# Patient Record
Sex: Female | Born: 1975 | Race: White | Hispanic: No | Marital: Single | Smoking: Never smoker
Health system: Southern US, Community
[De-identification: ages and names within clinical notes are randomized; demographics above are authoritative.]

---

## 1997-12-12 ENCOUNTER — Inpatient Hospital Stay (HOSPITAL_COMMUNITY): Admission: AD | Admit: 1997-12-12 | Discharge: 1997-12-12 | Payer: Self-pay | Admitting: Obstetrics and Gynecology

## 1998-01-21 ENCOUNTER — Inpatient Hospital Stay (HOSPITAL_COMMUNITY): Admission: AD | Admit: 1998-01-21 | Discharge: 1998-01-23 | Payer: Self-pay | Admitting: Obstetrics and Gynecology

## 1998-01-23 ENCOUNTER — Encounter: Admission: RE | Admit: 1998-01-23 | Discharge: 1998-04-23 | Payer: Self-pay | Admitting: Obstetrics and Gynecology

## 1998-06-01 ENCOUNTER — Encounter (HOSPITAL_COMMUNITY): Admission: RE | Admit: 1998-06-01 | Discharge: 1998-08-30 | Payer: Self-pay | Admitting: *Deleted

## 1999-09-21 ENCOUNTER — Other Ambulatory Visit: Admission: RE | Admit: 1999-09-21 | Discharge: 1999-09-21 | Payer: Self-pay | Admitting: Obstetrics and Gynecology

## 1999-11-04 ENCOUNTER — Inpatient Hospital Stay (HOSPITAL_COMMUNITY): Admission: AD | Admit: 1999-11-04 | Discharge: 1999-11-04 | Payer: Self-pay | Admitting: Obstetrics and Gynecology

## 2000-04-03 ENCOUNTER — Inpatient Hospital Stay (HOSPITAL_COMMUNITY): Admission: AD | Admit: 2000-04-03 | Discharge: 2000-04-04 | Payer: Self-pay | Admitting: Obstetrics and Gynecology

## 2000-05-16 ENCOUNTER — Other Ambulatory Visit: Admission: RE | Admit: 2000-05-16 | Discharge: 2000-05-16 | Payer: Self-pay | Admitting: Obstetrics and Gynecology

## 2000-06-05 ENCOUNTER — Encounter: Admission: RE | Admit: 2000-06-05 | Discharge: 2000-09-03 | Payer: Self-pay | Admitting: Obstetrics and Gynecology

## 2000-09-05 ENCOUNTER — Encounter: Admission: RE | Admit: 2000-09-05 | Discharge: 2000-10-20 | Payer: Self-pay | Admitting: Obstetrics and Gynecology

## 2001-05-19 ENCOUNTER — Other Ambulatory Visit: Admission: RE | Admit: 2001-05-19 | Discharge: 2001-05-19 | Payer: Self-pay | Admitting: Obstetrics and Gynecology

## 2002-06-16 ENCOUNTER — Other Ambulatory Visit: Admission: RE | Admit: 2002-06-16 | Discharge: 2002-06-16 | Payer: Self-pay | Admitting: Obstetrics and Gynecology

## 2003-06-21 ENCOUNTER — Other Ambulatory Visit: Admission: RE | Admit: 2003-06-21 | Discharge: 2003-06-21 | Payer: Self-pay | Admitting: Obstetrics and Gynecology

## 2003-11-07 ENCOUNTER — Encounter (INDEPENDENT_AMBULATORY_CARE_PROVIDER_SITE_OTHER): Payer: Self-pay | Admitting: *Deleted

## 2003-11-07 ENCOUNTER — Ambulatory Visit (HOSPITAL_BASED_OUTPATIENT_CLINIC_OR_DEPARTMENT_OTHER): Admission: RE | Admit: 2003-11-07 | Discharge: 2003-11-07 | Payer: Self-pay | Admitting: Otolaryngology

## 2003-11-07 ENCOUNTER — Ambulatory Visit (HOSPITAL_COMMUNITY): Admission: RE | Admit: 2003-11-07 | Discharge: 2003-11-07 | Payer: Self-pay | Admitting: Otolaryngology

## 2004-05-29 ENCOUNTER — Encounter: Admission: RE | Admit: 2004-05-29 | Discharge: 2004-05-29 | Payer: Self-pay | Admitting: Family Medicine

## 2004-08-04 ENCOUNTER — Emergency Department (HOSPITAL_COMMUNITY): Admission: EM | Admit: 2004-08-04 | Discharge: 2004-08-05 | Payer: Self-pay | Admitting: Emergency Medicine

## 2005-07-20 ENCOUNTER — Inpatient Hospital Stay (HOSPITAL_COMMUNITY): Admission: AD | Admit: 2005-07-20 | Discharge: 2005-07-20 | Payer: Self-pay | Admitting: Obstetrics & Gynecology

## 2005-08-11 ENCOUNTER — Inpatient Hospital Stay (HOSPITAL_COMMUNITY): Admission: AD | Admit: 2005-08-11 | Discharge: 2005-08-11 | Payer: Self-pay | Admitting: Obstetrics and Gynecology

## 2005-10-06 ENCOUNTER — Inpatient Hospital Stay (HOSPITAL_COMMUNITY): Admission: AD | Admit: 2005-10-06 | Discharge: 2005-10-06 | Payer: Self-pay | Admitting: Obstetrics and Gynecology

## 2005-10-14 ENCOUNTER — Inpatient Hospital Stay (HOSPITAL_COMMUNITY): Admission: AD | Admit: 2005-10-14 | Discharge: 2005-10-14 | Payer: Self-pay | Admitting: Obstetrics and Gynecology

## 2005-11-02 ENCOUNTER — Inpatient Hospital Stay (HOSPITAL_COMMUNITY): Admission: AD | Admit: 2005-11-02 | Discharge: 2005-11-02 | Payer: Self-pay | Admitting: *Deleted

## 2005-11-03 ENCOUNTER — Inpatient Hospital Stay (HOSPITAL_COMMUNITY): Admission: AD | Admit: 2005-11-03 | Discharge: 2005-11-03 | Payer: Self-pay | Admitting: *Deleted

## 2005-11-05 ENCOUNTER — Inpatient Hospital Stay (HOSPITAL_COMMUNITY): Admission: AD | Admit: 2005-11-05 | Discharge: 2005-11-08 | Payer: Self-pay | Admitting: Obstetrics and Gynecology

## 2005-11-09 ENCOUNTER — Encounter: Admission: RE | Admit: 2005-11-09 | Discharge: 2005-12-09 | Payer: Self-pay | Admitting: Obstetrics and Gynecology

## 2005-12-10 ENCOUNTER — Encounter: Admission: RE | Admit: 2005-12-10 | Discharge: 2006-01-08 | Payer: Self-pay | Admitting: Obstetrics and Gynecology

## 2006-01-09 ENCOUNTER — Encounter: Admission: RE | Admit: 2006-01-09 | Discharge: 2006-02-08 | Payer: Self-pay | Admitting: Obstetrics and Gynecology

## 2006-02-09 ENCOUNTER — Encounter: Admission: RE | Admit: 2006-02-09 | Discharge: 2006-03-10 | Payer: Self-pay | Admitting: Obstetrics and Gynecology

## 2006-03-11 ENCOUNTER — Encounter: Admission: RE | Admit: 2006-03-11 | Discharge: 2006-04-10 | Payer: Self-pay | Admitting: Obstetrics and Gynecology

## 2006-04-11 ENCOUNTER — Encounter: Admission: RE | Admit: 2006-04-11 | Discharge: 2006-05-11 | Payer: Self-pay | Admitting: Obstetrics and Gynecology

## 2006-05-12 ENCOUNTER — Encounter: Admission: RE | Admit: 2006-05-12 | Discharge: 2006-06-10 | Payer: Self-pay | Admitting: Obstetrics and Gynecology

## 2006-06-11 ENCOUNTER — Encounter: Admission: RE | Admit: 2006-06-11 | Discharge: 2006-07-11 | Payer: Self-pay | Admitting: Obstetrics and Gynecology

## 2007-02-22 ENCOUNTER — Emergency Department (HOSPITAL_COMMUNITY): Admission: EM | Admit: 2007-02-22 | Discharge: 2007-02-23 | Payer: Self-pay | Admitting: Emergency Medicine

## 2007-04-05 ENCOUNTER — Emergency Department (HOSPITAL_COMMUNITY): Admission: EM | Admit: 2007-04-05 | Discharge: 2007-04-05 | Payer: Self-pay | Admitting: Emergency Medicine

## 2007-04-12 ENCOUNTER — Inpatient Hospital Stay (HOSPITAL_COMMUNITY): Admission: AD | Admit: 2007-04-12 | Discharge: 2007-04-12 | Payer: Self-pay | Admitting: Obstetrics and Gynecology

## 2007-04-15 ENCOUNTER — Ambulatory Visit (HOSPITAL_COMMUNITY): Admission: RE | Admit: 2007-04-15 | Discharge: 2007-04-15 | Payer: Self-pay | Admitting: Obstetrics and Gynecology

## 2007-04-19 ENCOUNTER — Encounter: Admission: RE | Admit: 2007-04-19 | Discharge: 2007-04-19 | Payer: Self-pay | Admitting: Obstetrics and Gynecology

## 2007-05-06 ENCOUNTER — Ambulatory Visit (HOSPITAL_COMMUNITY): Admission: RE | Admit: 2007-05-06 | Discharge: 2007-05-06 | Payer: Self-pay | Admitting: Obstetrics and Gynecology

## 2007-05-06 ENCOUNTER — Encounter (INDEPENDENT_AMBULATORY_CARE_PROVIDER_SITE_OTHER): Payer: Self-pay | Admitting: Obstetrics and Gynecology

## 2007-06-09 ENCOUNTER — Emergency Department (HOSPITAL_COMMUNITY): Admission: EM | Admit: 2007-06-09 | Discharge: 2007-06-09 | Payer: Self-pay | Admitting: Emergency Medicine

## 2007-08-29 ENCOUNTER — Emergency Department (HOSPITAL_COMMUNITY): Admission: EM | Admit: 2007-08-29 | Discharge: 2007-08-29 | Payer: Self-pay | Admitting: Emergency Medicine

## 2007-10-10 ENCOUNTER — Emergency Department (HOSPITAL_COMMUNITY): Admission: EM | Admit: 2007-10-10 | Discharge: 2007-10-10 | Payer: Self-pay | Admitting: Emergency Medicine

## 2007-10-12 ENCOUNTER — Emergency Department (HOSPITAL_COMMUNITY): Admission: EM | Admit: 2007-10-12 | Discharge: 2007-10-12 | Payer: Self-pay | Admitting: Emergency Medicine

## 2007-10-23 ENCOUNTER — Emergency Department (HOSPITAL_COMMUNITY): Admission: EM | Admit: 2007-10-23 | Discharge: 2007-10-23 | Payer: Self-pay | Admitting: Emergency Medicine

## 2007-10-25 ENCOUNTER — Inpatient Hospital Stay (HOSPITAL_COMMUNITY): Admission: AD | Admit: 2007-10-25 | Discharge: 2007-10-25 | Payer: Self-pay | Admitting: *Deleted

## 2007-11-02 ENCOUNTER — Emergency Department (HOSPITAL_COMMUNITY): Admission: EM | Admit: 2007-11-02 | Discharge: 2007-11-02 | Payer: Self-pay | Admitting: Emergency Medicine

## 2007-11-04 ENCOUNTER — Emergency Department (HOSPITAL_COMMUNITY): Admission: EM | Admit: 2007-11-04 | Discharge: 2007-11-04 | Payer: Self-pay | Admitting: Family Medicine

## 2007-11-04 ENCOUNTER — Emergency Department (HOSPITAL_COMMUNITY): Admission: EM | Admit: 2007-11-04 | Discharge: 2007-11-05 | Payer: Self-pay | Admitting: Emergency Medicine

## 2007-11-14 ENCOUNTER — Emergency Department (HOSPITAL_COMMUNITY): Admission: EM | Admit: 2007-11-14 | Discharge: 2007-11-14 | Payer: Self-pay | Admitting: Emergency Medicine

## 2007-12-05 ENCOUNTER — Emergency Department (HOSPITAL_COMMUNITY): Admission: EM | Admit: 2007-12-05 | Discharge: 2007-12-05 | Payer: Self-pay | Admitting: Emergency Medicine

## 2007-12-12 ENCOUNTER — Emergency Department (HOSPITAL_COMMUNITY): Admission: EM | Admit: 2007-12-12 | Discharge: 2007-12-12 | Payer: Self-pay | Admitting: Emergency Medicine

## 2008-01-09 ENCOUNTER — Emergency Department (HOSPITAL_COMMUNITY): Admission: EM | Admit: 2008-01-09 | Discharge: 2008-01-09 | Payer: Self-pay | Admitting: Emergency Medicine

## 2008-05-09 ENCOUNTER — Emergency Department (HOSPITAL_COMMUNITY): Admission: EM | Admit: 2008-05-09 | Discharge: 2008-05-09 | Payer: Self-pay | Admitting: Emergency Medicine

## 2009-06-20 ENCOUNTER — Inpatient Hospital Stay (HOSPITAL_COMMUNITY): Admission: AD | Admit: 2009-06-20 | Discharge: 2009-06-20 | Payer: Self-pay | Admitting: Obstetrics & Gynecology

## 2009-10-18 ENCOUNTER — Inpatient Hospital Stay (HOSPITAL_COMMUNITY): Admission: AD | Admit: 2009-10-18 | Discharge: 2009-10-21 | Payer: Self-pay | Admitting: Obstetrics and Gynecology

## 2009-10-22 ENCOUNTER — Encounter: Admission: RE | Admit: 2009-10-22 | Discharge: 2009-11-21 | Payer: Self-pay | Admitting: Obstetrics & Gynecology

## 2009-12-20 ENCOUNTER — Encounter: Admission: RE | Admit: 2009-12-20 | Discharge: 2009-12-26 | Payer: Self-pay | Admitting: Obstetrics and Gynecology

## 2010-09-23 ENCOUNTER — Encounter: Payer: Self-pay | Admitting: Obstetrics and Gynecology

## 2010-11-23 LAB — CBC
HCT: 32.2 % — ABNORMAL LOW (ref 36.0–46.0)
HCT: 36.5 % (ref 36.0–46.0)
Hemoglobin: 10.8 g/dL — ABNORMAL LOW (ref 12.0–15.0)
Hemoglobin: 12.4 g/dL (ref 12.0–15.0)
MCHC: 33.7 g/dL (ref 30.0–36.0)
MCHC: 34 g/dL (ref 30.0–36.0)
MCV: 92.8 fL (ref 78.0–100.0)
MCV: 93.3 fL (ref 78.0–100.0)
Platelets: 123 10*3/uL — ABNORMAL LOW (ref 150–400)
Platelets: 149 10*3/uL — ABNORMAL LOW (ref 150–400)
RBC: 3.45 MIL/uL — ABNORMAL LOW (ref 3.87–5.11)
RBC: 3.94 MIL/uL (ref 3.87–5.11)
RDW: 13.9 % (ref 11.5–15.5)
RDW: 14 % (ref 11.5–15.5)
WBC: 11.3 10*3/uL — ABNORMAL HIGH (ref 4.0–10.5)
WBC: 12.9 10*3/uL — ABNORMAL HIGH (ref 4.0–10.5)

## 2010-11-23 LAB — RPR: RPR Ser Ql: NONREACTIVE

## 2011-01-15 NOTE — Op Note (Signed)
Cassandra Burgess, Cassandra Burgess                  ACCOUNT NO.:  [id number]   MEDICAL RECORD NO.:  [id number]          PATIENT TYPE:  AMB   LOCATION:  SDC                           FACILITY:  [facility]   PHYSICIAN:  Lenoard Aden, M.D.DATE OF BIRTH:  09-01-1976   DATE OF PROCEDURE:  05/06/2007  DATE OF DISCHARGE:                               OPERATIVE REPORT   PREOPERATIVE DIAGNOSIS:  Lost intrauterine device.   POSTOPERATIVE DIAGNOSIS:  Intrauterine device erosion through the lower  uterine segment with IUD adherent to the omentum and attached to the  lower uterine segment and also omental adhesions to the anterior  abdominal wall.   PROCEDURE:  Diagnostic laparoscopy, removal of intrauterine device,  lysis of adhesions.   SURGEON:  Lenoard Aden, M.D.   ASSISTANT:  Cordelia Pen A. Rosalio Macadamia, M.D.   ANESTHESIA:  General.   ESTIMATED BLOOD LOSS:  Less than 50 mL.   COMPLICATIONS:  None.   DRAINS:  None.   COUNTS:  Correct.   Patient to recovery in good condition.   BRIEF OP NOTE:  After being apprised of risks of anesthesia, infection,  bleeding, possible need for hysterectomy due to bleeding from IUD  removal, possible need for repair to bowel and bladder with organs with  potential injury, the patient brought to the operating where she was  administered a general anesthetic without complications, prepped and  draped usual sterile fashion.  Foley catheter placed.  Hulka tenaculum  placed per vagina.  Infraumbilical incision made with scalpel.  Veress  needle placed opening pressure -2 noted.  4L CO2 insufflated without  difficulty.  Trocar placed.  Atraumatic trocar entry noted.  Pictures  taken, normal liver, gallbladder bed, normal tubes, normal ovaries,  normal anterior and posterior cul-de-sac except in the anterior cul-de-  sac is noted that the Mirena IUD has eroded through the lower uterine  segment and is now attached to the lower uterine segment at its caudad  portion and both  arms are adherent to a piece of omentum which appears  to have traversed into the lower pelvis.  There is also omental  adhesions to the intra-abdominal wall.  A 5-mm port was then placed  suprapubically under direct visualization.  The IUD is elevated off the  lower uterine segment and removed sharply using sharp dissection with a  straight monopolar scissors.  Good hemostasis noted.  There is no injury  to the lower uterine segment noted.  No injury to the bladder flap was  noted and the omentum is without bleeding.  The IUD was removed through  the abdominal port and sent to pathology.  Strings which are noted are  also removed without difficulty.  At this time attention was turned  anterior abdominal wall adhesions whereby sharp dissection was performed  and the bleeding is controlled using Kleppinger bipolar cautery as the  omentum was dissected sharply off the anterior abdominal wall.  Good hemostasis was noted.  All instruments were removed under direct  visualization.  CO2 was released.  Incisions closed with a 0 Vicryl and  Dermabond.  Hulka tenaculum  removed per vagina and the Foley catheter  was removed.  The patient tolerated procedure well, was awakened and  transferred to recovery in good condition.      Lenoard Aden, M.D.  Electronically Signed     RJT/MEDQ  D:  05/06/2007  T:  05/06/2007  Job:  [id number]

## 2011-01-15 NOTE — Op Note (Signed)
Cassandra Burgess, Cassandra Burgess                  ACCOUNT NO.:  [id number]   MEDICAL RECORD NO.:  [id number]          PATIENT TYPE:  AMB   LOCATION:  SDC                           [facility]  [facility]   PHYSICIAN:  Lenoard Aden, M.D.DATE OF BIRTH:  [birth year]   DATE OF PROCEDURE:  04/15/2007  DATE OF DISCHARGE:                               OPERATIVE REPORT   PREOPERATIVE DIAGNOSIS:  Pelvic pain with lost intrauterine device.  IUD  identified in the lower uterine segment per ultrasound and the patient  declined MRI.   POSTOPERATIVE DIAGNOSIS:  Pelvic pain with lost intrauterine device.  IUD identified in the lower uterine segment per ultrasound and the  patient declined MRI.  Imbedded IUD.   OPERATION PERFORMED:  Diagnostic hysteroscopy, attempted removal of  intrauterine device.   SURGEON:  Lenoard Aden, M.D.   ASSISTANT:  None.   ANESTHESIA:  General by Raul Del, M.D.   ESTIMATED BLOOD LOSS:  Minimal.   COMPLICATIONS:  None.   DRAINS:  None.   Patient to recovery, good condition.   DESCRIPTION OF PROCEDURE:  After being apprised of the risks of  anesthesia, infection, bleeding, injury to abdominal organs, need for  repair, delayed versus immediate complications to include bowel and  bladder injury, the patient brought to the operating room where she was  administered general anesthetic without complications, prepped and  draped in the usual sterile fashion.  Catheterized until bladder was  emptied.  After placing a dilute Nesacaine solution, cervix was easily  dilated up to a 25 Pratt dilator.  Hysteroscope placed, visualization  reveals a normal fundal and normal bilateral tubal ostia.  However, at  this time visualization revealed in the lower uterine segment  anteriorly, two IUD strings which course into the myometrium and there  is no evidence of the IUD in the lower uterine segment, but the strings  are identified.  At this time grasper forceps are placed through  the  hysteroscope with attempts to grasp both strings which was done without  difficulty but attempts to remove the IUD were unsuccessful.  At this  time the strings are teased out through the cervix and dressing forceps  are used to grasp the strings whereby further attempts are made to try  to remove the IUD without success.  At this time minimal bleeding was  noted.  No  complications are noted.  Instruments are removed.  At this time the  patient was not consented for laparoscopic exploratory laparotomy;  therefore, procedure was terminated.  She was awakened and transferred  to recovery in good condition.      Lenoard Aden, M.D.  Electronically Signed     RJT/MEDQ  D:  04/15/2007  T:  04/16/2007  Job:  [id number]

## 2011-01-15 NOTE — Consult Note (Signed)
Cassandra Burgess, Cassandra Burgess                  ACCOUNT NO.:  [id number]   MEDICAL RECORD NO.:  [id number]          PATIENT TYPE:  MAT   LOCATION:  MATC                          [facility]ILITY:  [facility]   PHYSICIAN:  Lenoard Aden, M.D.DATE OF BIRTH:  [birth year]   DATE OF CONSULTATION:  04/12/2007  DATE OF DISCHARGE:  04/12/2007                                 CONSULTATION   CHIEF COMPLAINT:  Abdominal pain.   She is a 35 year old white female G3 P2 with history of Mirena IUD which  is documented to be normally placed, who presents with lower abdominal  cramping.  Her medications include Prozac, Lunesta, and Vicodin as  needed.  She is a nonsmoker, nondrinker.  She denies domestic physical  violence.   ALLERGIES:  PENICILLIN.   She has a history of MRSA, history of a broken jaw, history of hernia  repair, kidney stone, tonsillectomy, migraine headaches, disorder,  varicose veins, and vaginal deliveries x2.  She has a history of normal  labs, including normal CBC, TSH, and Pap smear in July 2008.  She had a  normal ultrasound performed within the last week.  She had an attempted  IUD removal in the office 1 week ago which was done unsuccessfully, but  was well tolerated by the patient.   PHYSICAL EXAMINATION:  VITAL SIGNS:  Blood pressure 120/80.  HEENT:  Normal.  LUNGS:  Clear.  HEART:  Regular rate and rhythm.  ABDOMEN:  Soft, nontender.  PELVIC:  Reveals a normal-sized uterus.  No adnexal masses.  EXTREMITIES:  Showed no clubbing, cyanosis, or edema.  NEUROLOGIC:  Nonfocal.  SKIN:  Intact.   No labs or ultrasound are ordered today due to previously normal workup.   IMPRESSION:  Lower abdominal cramping, questionably related to IUD use.   PLAN:  Discharge home.  Vicodin, #20 given, to take as needed.  We will  be scheduling hysteroscopic IUD removal.      Lenoard Aden, M.D.  Electronically Signed     RJT/MEDQ  D:  04/12/2007  T:  04/13/2007  Job:  [id number]

## 2011-01-18 NOTE — Consult Note (Signed)
Cassandra Burgess, Cassandra Burgess                  ACCOUNT NO.:  [id number]   MEDICAL RECORD NO.:  [id number]          PATIENT TYPE:  MAT   LOCATION:  MATC                          [facility]   PHYSICIAN:  Lenoard Aden, M.D.DATE OF BIRTH:  [birth year]   DATE OF CONSULTATION:  10/14/2005  DATE OF DISCHARGE:                                   CONSULTATION   CHIEF COMPLAINT:  Rule out labor.   HISTORY OF PRESENT ILLNESS:  The patient is a 35 year old white female, G3,  P2, [redacted] weeks gestation, with headache and increased frequency of  contractions.  Negative FFN one week ago, and no cervical change noted.  Pregnancy is remarkable for 2 uncomplicated term deliveries.   ALLERGIES:  PHENERGAN.   MEDICATIONS:  1.  Prenatal vitamins.  2.  Norco.  3.  Procardia as needed.   PHYSICAL EXAMINATION:  GENERAL:  She is a well-developed, well-nourished  white female in no acute distress.  HEENT:  Normal.  LUNGS:  Clear.  HEART:  Regular rate and rhythm.  ABDOMEN:  Soft, gravid, nontender.  No CVA tenderness.  EXTREMITIES:  No cords.  NEUROLOGIC:  Nonfocal.  PELVIC:  Cervix is closed, 3 cm long, firm, vertex and -2 to -3.   NST is reactive.  Contractions are irregular.   IMPRESSION:  1.  Thirty-four week OB.  2.  Preterm contractions; no cervical change.  3.  Acute migraine, now with marked improvement.   PLAN:  Discharge home.  Follow up in the office in 48 hours.  Continue  Procardia p.r.n.  Use Norco 10; #30 given.  Take one every 4-6 hours.      Lenoard Aden, M.D.  Electronically Signed     RJT/MEDQ  D:  10/14/2005  T:  10/14/2005  Job:  [id number]   cc:   Ma Hillock

## 2011-01-18 NOTE — H&P (Signed)
Cassandra Burgess, Cassandra Burgess                  ACCOUNT NO.:  [id number]   MEDICAL RECORD NO.:  [id number]          PATIENT TYPE:  INP   LOCATION:  9130                          [facility]ACILITY:  [facility]   PHYSICIAN:  Lenoard Aden, M.D.DATE OF BIRTH:  10/25/1975   DATE OF ADMISSION:  11/05/2005  DATE OF DISCHARGE:                                HISTORY & PHYSICAL   CHIEF COMPLAINT:  Persistent severe migraines for induction at 37 weeks and  favorable cervix.   She is a 35 year old white female G3, P2, EDD at November 18, 2005 at [redacted] weeks  gestation who presents for elective induction due to severe migraine  headaches, persistent Demerol and narcotic use for elective delivery.  These  migraines appear to be pregnancy associated.  She has allergies to PHENERGAN  and PENICILLIN.  She is a nonsmoker, nondrinker.  Denies domestic or  physical violence.  History of two spontaneous vaginal deliveries.   FAMILY HISTORY:  Noncontributory.   Previous history of eating disorder and urolithiasis.   PRENATAL LABORATORIES:  Blood type B+.  Rh antibody negative.  Rubella  immune.  Hepatitis and HIV nonreactive.   PHYSICAL EXAMINATION:  GENERAL:  She is a well-developed, well-nourished  white female.  No acute distress.  HEENT:  Normal.  LUNGS:  Clear.  HEART:  Regular rate and rhythm.  ABDOMEN:  Soft, gravid, nontender.  Estimated fetal weight 7-7.5 pounds.  PELVIC:  Cervix is 2-3 cm, 50%, vertex -1.  EXTREMITIES:  No cords.  NEUROLOGIC:  Nonfocal.   IMPRESSION:  1.  38-week intrauterine pregnancy.  2.  Persistent pregnancy associated severe migraine headaches with      persistent narcotic use.   PLAN:  Will proceed with induction.  Risks, benefits discussed.  Small risk  of prematurity noted.  Patient in narcotic withdrawal.  Patient  acknowledges.  Will proceed.      Lenoard Aden, M.D.  Electronically Signed     RJT/MEDQ  D:  11/05/2005  T:  11/05/2005  Job:  [id number]

## 2011-01-18 NOTE — Op Note (Signed)
NAME:  Cassandra Burgess, Cassandra Burgess                            ACCOUNT NO.:  [id number]   MEDICAL RECORD NO.:  [id number]                   PATIENT TYPE:  AMB   LOCATION:  DSC                                  FACILITY:  [facility]   PHYSICIAN:  Hermelinda Medicus, M.D.                DATE OF BIRTH:  09-04-1975   DATE OF PROCEDURE:  DATE OF DISCHARGE:                                 OPERATIVE REPORT   PREOPERATIVE DIAGNOSIS:  Tonsillitis with history of tonsillar bleeding.   POSTOPERATIVE DIAGNOSIS:  Tonsillitis with history of tonsillar bleeding.   OPERATION:  Tonsillectomy.   ANESTHESIA:  General endotracheal with Dr. Gelene Mink.   SURGEON:  Hermelinda Medicus, M.D.   PROCEDURE:  The patient was placed in the supine position under general  endotracheal anesthesia. The tonsillar gag was placed.  The tonsils were  examined carefully, and cottage cheesy-type material was exuded from these  once they were grasped with tenaculum.  Using the Bovie electrocoagulation  and blunt dissection, the mucous membrane was incised.  With blunt  dissection, the mucous membrane was pushed back. The tonsil was extracted,  and then Bovie electrocoagulation was used to excise this and to establish  all hemostasis.  The hemostasis was checked once the tonsil was out and was  completed.  On the right side, again the same situation occurred except this  was more severe.  This was the side that had had the bleeding problems.  Again, exudate was noted.  Again, we Bovied all bleeders as we removed the  tonsils, and once the tonsils were removed, the tonsillar bleeders were  again checked.  Tonsillar beds were completely free of any bleeding and  completely dry.  The stomach was suctioned.  The patient was awakened.  The  patient tolerated the procedure well and is doing well postoperatively.   Her followup will be in one week, three weeks, and six weeks.                                               Hermelinda Medicus, M.D.    JC/MEDQ  D:  11/07/2003  T:  11/07/2003  Job:  [id number]   cc:   Lenoard Aden, M.D.  [address]  [place]  [place] [address]  Fax: [phone]   [facility]

## 2011-01-18 NOTE — H&P (Signed)
NAME:  Cassandra Burgess, Cassandra Burgess                            ACCOUNT NO.:  [id number]   MEDICAL RECORD NO.:  [id number]                   PATIENT TYPE:  AMB   LOCATION:  DSC                                  FACILITY:  [facility]   PHYSICIAN:  Hermelinda Medicus, M.D.                DATE OF BIRTH:  [birth year]   DATE OF ADMISSION:  DATE OF DISCHARGE:                                HISTORY & PHYSICAL   This patient is a 35 year old female who has had tonsillitis in the past on  several occasions.  She has been on multiple antibiotics, has been on  antibiotics going back to her childhood, but more recently, she has noted  debris from her tonsils.  She has had lymphadenopathy in her neck and  especially in her right tonsil, she has had chronic infections with  bleeding.  She now enters for a tonsillectomy as essentially she is just  not getting over the infections.   PAST MEDICAL HISTORY:  This is remarkable in the fact that she does not  drink or smoke.  She does have migraines at times.   ALLERGIES:  PENICILLIN causes itches.   MEDICATIONS:  She takes Prozac and she uses an IUD.   PAST SURGICAL HISTORY:  She has not had surgery before.   FAMILY HISTORY:  She has two children with no history of any problems.   PHYSICAL EXAMINATION:  VITAL SIGNS:  Blood pressure of 97.64, height 5 feet  7 inches, weight 123.  HEENT:  Ears are clear. Tympanic membranes are clear.  The oral cavity shows  some exudative tonsillitis on both sides and her neck shows mild cervical  adenitis.  CHEST:  Clear, no wheezing, rhonchi or rales.  CARDIOVASCULAR:  No mixed sounds, murmurs or gallops.  ABDOMEN:  Free of any organomegaly, tenderness or mass.  EXTREMITIES:  Unremarkable.   INITIAL DIAGNOSIS:  Tonsillitis with history of tonsillar bleeding with  history of allergy to penicillin and amoxicillin.                                                Hermelinda Medicus, M.D.    JC/MEDQ  D:  11/07/2003  T:  11/07/2003  Job:   [id number]   cc:   Lenoard Aden, M.D.  [address]  [place]  [place] [address]  Fax: [phone]   [facility]

## 2011-01-18 NOTE — H&P (Signed)
[facility]  Patient:    Cassandra Burgess, Cassandra Burgess                         MRN: [id number] Adm. Date:  [id number] Attending:  Silverio Lay A                         History and Physical  REASON FOR ADMISSION:         Regular uterine contraction.  HISTORY OF PRESENT ILLNESS:   This is a 35 year old married white female, gravida 2, para 1, with an estimated delivery date of April 19, 2000, being admitted at 37 weeks and 5 days complaining of regular uterine contractions every 3-5 minutes since midnight tonight.  She reports mild bloody show, denies any leakage of fluid, reports good fetal activity and denies any symptoms of pregnancy-induced hypertension.  Prenatal course reveals blood type B positive, RPR nonreactive, HBsAg negative, rubella immune, HIV declined, 16-week AFP declined, 20-week ultrasound revealing a normal anatomy survey with a posterior placenta.  A 28-week glucose tolerance test is within normal limits.  A 35-week group B strep is negative.  Prenatal course was otherwise uneventful.  PAST MEDICAL HISTORY:         No known drug allergy.  In May 1999, a spontaneous vaginal delivery at 40 weeks of a female infant weighing 8 pounds 12 ounces, no complications besides meconium-stained amniotic fluid. 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  FAMILY HISTORY:               Mother with type 2 diabetes.  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  PHYSICAL EXAMINATION:  VITAL SIGNS:                  Normal.  HEAD, EYES, EARS, NOSE, AND THROAT:                       Negative.  LUNGS:                        Clear.  HEART:                        Normal.  ABDOMEN:                      Gravid, nontender.  EXTREMITIES:                  No edema.  PELVIC:                       A vaginal exam by maternity admission unit nurse; 7 cm, completely  effaced, bulging bag of water.  Fetal heart rate tracings reviewed and reactive, no decelerations.  Contraction pattern every 2-3 minutes.  ASSESSMENT:                   1.  Intrauterine pregnancy at 37 weeks and                                   5 days.  2.  Active labor.  PLAN:                         The patient is admitted to labor and delivery. Spontaneous vaginal delivery expected.  May receive epidural. DD:  04/03/00 TD:  04/03/00 Job: [id number]

## 2011-05-27 LAB — CBC
HCT: 38
HCT: 38.5
Hemoglobin: 12.9
MCHC: 33.5
MCV: 91.9
MCV: 92.2
Platelets: 247
RBC: 4.13
RBC: 4.18
RDW: 13.3
WBC: 12.3 — ABNORMAL HIGH
WBC: 9.9

## 2011-05-27 LAB — URINE MICROSCOPIC-ADD ON: WBC, UA: NONE SEEN

## 2011-05-27 LAB — URINALYSIS, ROUTINE W REFLEX MICROSCOPIC
Bilirubin Urine: NEGATIVE
Bilirubin Urine: NEGATIVE
Glucose, UA: NEGATIVE
Glucose, UA: NEGATIVE
Hgb urine dipstick: NEGATIVE
Ketones, ur: NEGATIVE
Ketones, ur: NEGATIVE
Ketones, ur: NEGATIVE
Leukocytes, UA: NEGATIVE
Nitrite: NEGATIVE
Nitrite: NEGATIVE
Nitrite: NEGATIVE
Protein, ur: NEGATIVE
Protein, ur: NEGATIVE
Specific Gravity, Urine: 1.01
Specific Gravity, Urine: 1.007
Specific Gravity, Urine: 1.01
Urobilinogen, UA: 0.2
Urobilinogen, UA: 0.2
pH: 7
pH: 7.5
pH: 8.5 — ABNORMAL HIGH

## 2011-05-27 LAB — COMPREHENSIVE METABOLIC PANEL
ALT: 16
AST: 20
Albumin: 4.1
Alkaline Phosphatase: 61
BUN: 7
CO2: 30
Calcium: 9.1
Chloride: 100
Creatinine, Ser: 0.81
GFR calc Af Amer: >60
GFR calc non Af Amer: >60
Glucose, Bld: 115 — ABNORMAL HIGH
Potassium: 3.6
Sodium: 137
Total Bilirubin: 0.4
Total Protein: 6.4

## 2011-05-27 LAB — DIFFERENTIAL
Basophils Absolute: 0
Basophils Relative: 0
Eosinophils Absolute: 0.2
Eosinophils Absolute: 0.2
Eosinophils Relative: 1
Eosinophils Relative: 2
Lymphocytes Relative: 23
Lymphs Abs: 2.1
Lymphs Abs: 2.3
Monocytes Absolute: 0.8
Monocytes Relative: 8
Monocytes Relative: 8
Neutro Abs: 6.7
Neutrophils Relative %: 67

## 2011-05-27 LAB — PREGNANCY, URINE: Preg Test, Ur: NEGATIVE

## 2011-05-27 LAB — I-STAT 8, (EC8 V) (CONVERTED LAB)
BUN: 13
BUN: 7
Bicarbonate: 27.1 — ABNORMAL HIGH
Bicarbonate: 30.3 — ABNORMAL HIGH
Glucose, Bld: 101 — ABNORMAL HIGH
HCT: 39
Operator id: 284251
pCO2, Ven: 46.7
pCO2, Ven: 52.6 — ABNORMAL HIGH
pH, Ven: 7.368 — ABNORMAL HIGH

## 2011-05-27 LAB — GC/CHLAMYDIA PROBE AMP, GENITAL
Chlamydia, DNA Probe: NEGATIVE
Chlamydia, DNA Probe: NEGATIVE
GC Probe Amp, Genital: NEGATIVE

## 2011-05-27 LAB — WET PREP, GENITAL
Clue Cells Wet Prep HPF POC: NONE SEEN
Trich, Wet Prep: NONE SEEN
Trich, Wet Prep: NONE SEEN
Yeast Wet Prep HPF POC: NONE SEEN
Yeast Wet Prep HPF POC: NONE SEEN

## 2011-05-27 LAB — POCT I-STAT CREATININE
Creatinine, Ser: 0.9
Creatinine, Ser: 1.1

## 2011-05-27 LAB — ROTAVIRUS ANTIGEN, STOOL

## 2011-06-14 LAB — CBC
HCT: 39.8
Hemoglobin: 13.8
RBC: 4.21
RDW: 14
WBC: 6

## 2011-06-14 LAB — SAMPLE TO BLOOD BANK

## 2011-06-17 LAB — I-STAT 8, (EC8 V) (CONVERTED LAB)
Acid-Base Excess: 2
Acid-Base Excess: 6 — ABNORMAL HIGH
BUN: 11
BUN: 6
Bicarbonate: 26.1 — ABNORMAL HIGH
Chloride: 102
Chloride: 107
Glucose, Bld: 114 — ABNORMAL HIGH
HCT: 37
HCT: 48 — ABNORMAL HIGH
Hemoglobin: 16.3 — ABNORMAL HIGH
Operator id: 285491
Operator id: 285491
Potassium: 4.1
Potassium: 4.5
Sodium: 137
TCO2: 27
pCO2, Ven: 25.7 — ABNORMAL LOW
pCO2, Ven: 44.1 — ABNORMAL LOW
pH, Ven: 7.615

## 2011-06-17 LAB — CBC
HCT: 34.9 — ABNORMAL LOW
Hemoglobin: 11.9 — ABNORMAL LOW
Hemoglobin: 12.1
Hemoglobin: 13.2
MCHC: 34.3
MCHC: 34.6
MCV: 93.7
MCV: 94.2
RBC: 3.69 — ABNORMAL LOW
RBC: 3.74 — ABNORMAL LOW
RBC: 4.08
RDW: 13.1
WBC: 4.9
WBC: 5.4

## 2011-06-17 LAB — DIFFERENTIAL
Basophils Absolute: 0
Basophils Relative: 1
Basophils Relative: 1
Eosinophils Absolute: 0.3
Eosinophils Absolute: 0.1
Eosinophils Relative: 6 — ABNORMAL HIGH
Lymphocytes Relative: 39
Lymphs Abs: 1.9
Lymphs Abs: 2
Monocytes Absolute: 0.5
Monocytes Absolute: 0.5
Monocytes Relative: 9
Monocytes Relative: 9
Neutro Abs: 2.2
Neutro Abs: 2.8
Neutrophils Relative %: 46

## 2011-06-17 LAB — WET PREP, GENITAL
Trich, Wet Prep: NONE SEEN
Yeast Wet Prep HPF POC: NONE SEEN

## 2011-06-17 LAB — POCT I-STAT CREATININE
Creatinine, Ser: 0.7
Creatinine, Ser: 1.1
Operator id: 285491

## 2011-06-17 LAB — SAMPLE TO BLOOD BANK

## 2011-06-17 LAB — URINALYSIS, ROUTINE W REFLEX MICROSCOPIC
Bilirubin Urine: NEGATIVE
Glucose, UA: NEGATIVE
Ketones, ur: NEGATIVE
Specific Gravity, Urine: 1.019
pH: 8.5 — ABNORMAL HIGH

## 2011-06-17 LAB — PREGNANCY, URINE: Preg Test, Ur: NEGATIVE

## 2011-06-17 LAB — GC/CHLAMYDIA PROBE AMP, GENITAL: GC Probe Amp, Genital: NEGATIVE

## 2011-06-19 LAB — URINALYSIS, ROUTINE W REFLEX MICROSCOPIC
Bilirubin Urine: NEGATIVE
Glucose, UA: NEGATIVE
Specific Gravity, Urine: 1.019
Urobilinogen, UA: 0.2

## 2011-06-19 LAB — DIFFERENTIAL
Eosinophils Absolute: 0
Lymphs Abs: 0.5 — ABNORMAL LOW
Monocytes Relative: 4
Neutro Abs: 13.1 — ABNORMAL HIGH
Neutrophils Relative %: 92 — ABNORMAL HIGH

## 2011-06-19 LAB — URINE MICROSCOPIC-ADD ON

## 2011-06-19 LAB — CBC
MCV: 95.9
Platelets: 265
RBC: 4.46
WBC: 14.3 — ABNORMAL HIGH

## 2011-06-19 LAB — BASIC METABOLIC PANEL
BUN: 12
Calcium: 9.2
Chloride: 102
Creatinine, Ser: 0.89
GFR calc Af Amer: >60
GFR calc non Af Amer: >60

## 2011-06-19 LAB — PREGNANCY, URINE: Preg Test, Ur: NEGATIVE

## 2011-11-22 ENCOUNTER — Telehealth: Payer: Self-pay | Admitting: Family Medicine

## 2011-11-22 NOTE — Telephone Encounter (Signed)
error 

## 2015-05-24 ENCOUNTER — Encounter (HOSPITAL_COMMUNITY): Payer: Self-pay | Admitting: Emergency Medicine

## 2015-05-24 ENCOUNTER — Emergency Department (HOSPITAL_COMMUNITY): Payer: Medicaid Other

## 2015-05-24 ENCOUNTER — Emergency Department (HOSPITAL_COMMUNITY)
Admission: EM | Admit: 2015-05-24 | Discharge: 2015-05-24 | Disposition: A | Discharge status: Home / Self Care | Payer: Medicaid Other | Attending: Emergency Medicine | Admitting: Emergency Medicine

## 2015-05-24 DIAGNOSIS — Z79891 Long term (current) use of opiate analgesic: Secondary | ICD-10-CM | POA: Insufficient documentation

## 2015-05-24 DIAGNOSIS — R630 Anorexia: Secondary | ICD-10-CM | POA: Diagnosis not present

## 2015-05-24 DIAGNOSIS — Y92481 Parking lot as the place of occurrence of the external cause: Secondary | ICD-10-CM | POA: Insufficient documentation

## 2015-05-24 DIAGNOSIS — Y999 Unspecified external cause status: Secondary | ICD-10-CM | POA: Diagnosis not present

## 2015-05-24 DIAGNOSIS — R42 Dizziness and giddiness: Secondary | ICD-10-CM | POA: Insufficient documentation

## 2015-05-24 DIAGNOSIS — R251 Tremor, unspecified: Secondary | ICD-10-CM | POA: Insufficient documentation

## 2015-05-24 DIAGNOSIS — S0230XA Fracture of orbital floor, unspecified side, initial encounter for closed fracture: Secondary | ICD-10-CM

## 2015-05-24 DIAGNOSIS — Y9389 Activity, other specified: Secondary | ICD-10-CM | POA: Diagnosis not present

## 2015-05-24 DIAGNOSIS — W1839XA Other fall on same level, initial encounter: Secondary | ICD-10-CM | POA: Insufficient documentation

## 2015-05-24 DIAGNOSIS — Z7951 Long term (current) use of inhaled steroids: Secondary | ICD-10-CM | POA: Insufficient documentation

## 2015-05-24 DIAGNOSIS — R112 Nausea with vomiting, unspecified: Secondary | ICD-10-CM | POA: Insufficient documentation

## 2015-05-24 DIAGNOSIS — Z880 Allergy status to penicillin: Secondary | ICD-10-CM | POA: Diagnosis not present

## 2015-05-24 DIAGNOSIS — R197 Diarrhea, unspecified: Secondary | ICD-10-CM | POA: Diagnosis not present

## 2015-05-24 DIAGNOSIS — S02402A Zygomatic fracture, unspecified, initial encounter for closed fracture: Secondary | ICD-10-CM | POA: Insufficient documentation

## 2015-05-24 DIAGNOSIS — R55 Syncope and collapse: Secondary | ICD-10-CM | POA: Diagnosis present

## 2015-05-24 DIAGNOSIS — S02401A Maxillary fracture, unspecified, initial encounter for closed fracture: Secondary | ICD-10-CM | POA: Insufficient documentation

## 2015-05-24 DIAGNOSIS — E860 Dehydration: Secondary | ICD-10-CM | POA: Diagnosis not present

## 2015-05-24 DIAGNOSIS — Z23 Encounter for immunization: Secondary | ICD-10-CM | POA: Insufficient documentation

## 2015-05-24 DIAGNOSIS — S0280XA Fracture of other specified skull and facial bones, unspecified side, initial encounter for closed fracture: Secondary | ICD-10-CM

## 2015-05-24 LAB — CBG MONITORING, ED: Glucose-Capillary: 157 mg/dL — ABNORMAL HIGH (ref 65–99)

## 2015-05-24 LAB — CBC WITH DIFFERENTIAL/PLATELET
Basophils Absolute: 0 10*3/uL (ref 0.0–0.1)
Basophils Relative: 0 %
EOS ABS: 0 10*3/uL (ref 0.0–0.7)
EOS PCT: 0 %
HCT: 39.3 % (ref 36.0–46.0)
HEMOGLOBIN: 12.9 g/dL (ref 12.0–15.0)
LYMPHS ABS: 0.7 10*3/uL (ref 0.7–4.0)
LYMPHS PCT: 5 %
MCH: 28.4 pg (ref 26.0–34.0)
MCHC: 32.8 g/dL (ref 30.0–36.0)
MCV: 86.6 fL (ref 78.0–100.0)
MONOS PCT: 5 %
Monocytes Absolute: 0.7 10*3/uL (ref 0.1–1.0)
Neutro Abs: 12.6 10*3/uL — ABNORMAL HIGH (ref 1.7–7.7)
Neutrophils Relative %: 90 %
PLATELETS: 291 10*3/uL (ref 150–400)
RBC: 4.54 MIL/uL (ref 3.87–5.11)
RDW: 13.1 % (ref 11.5–15.5)
WBC: 13.9 10*3/uL — ABNORMAL HIGH (ref 4.0–10.5)

## 2015-05-24 LAB — I-STAT BETA HCG BLOOD, ED (MC, WL, AP ONLY): I-stat hCG, quantitative: <5 m[IU]/mL (ref <5)

## 2015-05-24 LAB — BASIC METABOLIC PANEL
Anion gap: 7 (ref 5–15)
BUN: 7 mg/dL (ref 6–20)
CHLORIDE: 102 mmol/L (ref 101–111)
CO2: 28 mmol/L (ref 22–32)
CREATININE: 0.93 mg/dL (ref 0.44–1.00)
Calcium: 9.4 mg/dL (ref 8.9–10.3)
GFR calc Af Amer: >60 mL/min (ref >60)
GFR calc non Af Amer: >60 mL/min (ref >60)
GLUCOSE: 135 mg/dL — AB (ref 65–99)
POTASSIUM: 3.6 mmol/L (ref 3.5–5.1)
SODIUM: 137 mmol/L (ref 135–145)

## 2015-05-24 LAB — I-STAT TROPONIN, ED: TROPONIN I, POC: 0 ng/mL (ref 0.00–0.08)

## 2015-05-24 LAB — PHOSPHORUS: Phosphorus: 2.1 mg/dL — ABNORMAL LOW (ref 2.5–4.6)

## 2015-05-24 LAB — MAGNESIUM: MAGNESIUM: 2 mg/dL (ref 1.7–2.4)

## 2015-05-24 MED ORDER — ONDANSETRON HCL 4 MG/2ML IJ SOLN
4.0000 mg | Freq: Once | INTRAMUSCULAR | Status: AC
  Administered 2015-05-24: 4 mg via INTRAVENOUS
  Filled 2015-05-24: qty 2

## 2015-05-24 MED ORDER — TETANUS-DIPHTH-ACELL PERTUSSIS 5-2.5-18.5 LF-MCG/0.5 IM SUSP
0.5000 mL | Freq: Once | INTRAMUSCULAR | Status: AC
  Administered 2015-05-24: 0.5 mL via INTRAMUSCULAR
  Filled 2015-05-24 (×2): qty 0.5

## 2015-05-24 MED ORDER — ONDANSETRON HCL 4 MG PO TABS: 4.0000 mg | ORAL_TABLET | Freq: Three times a day (TID) | ORAL | Status: AC | PRN

## 2015-05-24 MED ORDER — SODIUM CHLORIDE 0.9 % IV BOLUS (SEPSIS)
1000.0000 mL | Freq: Once | INTRAVENOUS | Status: AC
  Administered 2015-05-24: 1000 mL via INTRAVENOUS

## 2015-05-24 MED ORDER — ACETAMINOPHEN 500 MG PO TABS
1000.0000 mg | ORAL_TABLET | Freq: Once | ORAL | Status: AC
  Administered 2015-05-24: 1000 mg via ORAL
  Filled 2015-05-24: qty 2

## 2015-05-24 NOTE — Discharge Instructions (Signed)
Return without fail for worsening symptoms, including worsening pain, vomiting and unable to keep down food or fluids, fever, confusion, recurrent episodes of passing out, or any other symptoms concerning to you. Take motrin 600 mg every 6 hours as needed for pain or tylenol 650 mg every 6 hours as needed for pain. Follow-up with the head and neck doctors in 5 days for re-evaluation of your facial fractures.  Syncope Syncope is a medical term for fainting or passing out. This means you lose consciousness and drop to the ground. People are generally unconscious for less than 5 minutes. You may have some muscle twitches for up to 15 seconds before waking up and returning to normal. Syncope occurs more often in older adults, but it can happen to anyone. While most causes of syncope are not dangerous, syncope can be a sign of a serious medical problem. It is important to seek medical care.  CAUSES  Syncope is caused by a sudden drop in blood flow to the brain. The specific cause is often not determined. Factors that can bring on syncope include:  Taking medicines that lower blood pressure.  Sudden changes in posture, such as standing up quickly.  Taking more medicine than prescribed.  Standing in one place for too long.  Seizure disorders.  Dehydration and excessive exposure to heat.  Low blood sugar (hypoglycemia).  Straining to have a bowel movement.  Heart disease, irregular heartbeat, or other circulatory problems.  Fear, emotional distress, seeing blood, or severe pain. SYMPTOMS  Right before fainting, you may:  Feel dizzy or light-headed.  Feel nauseous.  See all white or all black in your field of vision.  Have cold, clammy skin. DIAGNOSIS  Your health care provider will ask about your symptoms, perform a physical exam, and perform an electrocardiogram (ECG) to record the electrical activity of your heart. Your health care provider may also perform other heart or blood tests  to determine the cause of your syncope which may include:  Transthoracic echocardiogram (TTE). During echocardiography, sound waves are used to evaluate how blood flows through your heart.  Transesophageal echocardiogram (TEE).  Cardiac monitoring. This allows your health care provider to monitor your heart rate and rhythm in real time.  Holter monitor. This is a portable device that records your heartbeat and can help diagnose heart arrhythmias. It allows your health care provider to track your heart activity for several days, if needed.  Stress tests by exercise or by giving medicine that makes the heart beat faster. TREATMENT  In most cases, no treatment is needed. Depending on the cause of your syncope, your health care provider may recommend changing or stopping some of your medicines. HOME CARE INSTRUCTIONS  Have someone stay with you until you feel stable.  Do not drive, use machinery, or play sports until your health care provider says it is okay.  Keep all follow-up appointments as directed by your health care provider.  Lie down right away if you start feeling like you might faint. Breathe deeply and steadily. Wait until all the symptoms have passed.  Drink enough fluids to keep your urine clear or pale yellow.  If you are taking blood pressure or heart medicine, get up slowly and take several minutes to sit and then stand. This can reduce dizziness. SEEK IMMEDIATE MEDICAL CARE IF:   You have a severe headache.  You have unusual pain in the chest, abdomen, or back.  You are bleeding from your mouth or rectum, or you have  black or tarry stool.  You have an irregular or very fast heartbeat.  You have pain with breathing.  You have repeated fainting or seizure-like jerking during an episode.  You faint when sitting or lying down.  You have confusion.  You have trouble walking.  You have severe weakness.  You have vision problems. If you fainted, call your local  emergency services (911 in [place] Do not drive yourself to the hospital.  MAKE SURE YOU:  Understand these instructions.  Will watch your condition.  Will get help right away if you are not doing well or get worse. Document Released: 08/19/2005 Document Revised: 08/24/2013 Document Reviewed: 10/18/2011 [facility] Patient Information 2015 [place], [place]. This information is not intended to replace advice given to you by your health care provider. Make sure you discuss any questions you have with your health care provider.  Diarrhea Diarrhea is watery poop (stool). It can make you feel weak, tired, thirsty, or give you a dry mouth (signs of dehydration). Watery poop is a sign of another problem, most often an infection. It often lasts 2-3 days. It can last longer if it is a sign of something serious. Take care of yourself as told by your doctor. HOME CARE   Drink 1 cup (8 ounces) of fluid each time you have watery poop.  Do not drink the following fluids:  Those that contain simple sugars (fructose, glucose, galactose, lactose, sucrose, maltose).  Sports drinks.  Fruit juices.  Whole milk products.  Sodas.  Drinks with caffeine (coffee, tea, soda) or alcohol.  Oral rehydration solution may be used if the doctor says it is okay. You may make your own solution. Follow this recipe:   - teaspoon table salt.   teaspoon baking soda.   teaspoon salt substitute containing potassium chloride.  1 tablespoons sugar.  1 liter (34 ounces) of water.  Avoid the following foods:  High fiber foods, such as raw fruits and vegetables.  Nuts, seeds, and whole grain breads and cereals.   Those that are sweetened with sugar alcohols (xylitol, sorbitol, mannitol).  Try eating the following foods:  Starchy foods, such as rice, toast, pasta, low-sugar cereal, oatmeal, baked potatoes, crackers, and bagels.  Bananas.  Applesauce.  Eat probiotic-rich foods, such as yogurt and milk  products that are fermented.  Wash your hands well after each time you have watery poop.  Only take medicine as told by your doctor.  Take a warm bath to help lessen burning or pain from having watery poop. GET HELP RIGHT AWAY IF:   You cannot drink fluids without throwing up (vomiting).  You keep throwing up.  You have blood in your poop, or your poop looks black and tarry.  You do not pee (urinate) in 6-8 hours, or there is only a small amount of very dark pee.  You have belly (abdominal) pain that gets worse or stays in the same spot (localizes).  You are weak, dizzy, confused, or light-headed.  You have a very bad headache.  Your watery poop gets worse or does not get better.  You have a fever or lasting symptoms for more than 2-3 days.  You have a fever and your symptoms suddenly get worse. MAKE SURE YOU:   Understand these instructions.  Will watch your condition.  Will get help right away if you are not doing well or get worse. Document Released: 02/05/2008 Document Revised: 01/03/2014 Document Reviewed: 04/26/2012 [facility] Patient Information 2015 [place], [place]. This information is not intended  to replace advice given to you by your health care provider. Make sure you discuss any questions you have with your health care provider.  Facial Fracture A facial fracture is a break in one of the bones of your face. HOME CARE INSTRUCTIONS   Protect the injured part of your face until it is healed.  Do not participate in activities which give chance for re-injury until your doctor approves.  Gently wash and dry your face.  Wear head and facial protection while riding a bicycle, motorcycle, or snowmobile. SEEK MEDICAL CARE IF:   An oral temperature above 102 F (38.9 C) develops.  You have severe headaches or notice changes in your vision.  You have new numbness or tingling in your face.  You develop nausea (feeling sick to your stomach), vomiting or a stiff  neck. SEEK IMMEDIATE MEDICAL CARE IF:   You develop difficulty seeing or experience double vision.  You become dizzy, lightheaded, or faint.  You develop trouble speaking, breathing, or swallowing.  You have a watery discharge from your nose or ear. MAKE SURE YOU:   Understand these instructions.  Will watch your condition.  Will get help right away if you are not doing well or get worse. Document Released: 08/19/2005 Document Revised: 11/11/2011 Document Reviewed: 04/07/2008 [facility] Patient Information 2015 [place], [place]. This information is not intended to replace advice given to you by your health care provider. Make sure you discuss any questions you have with your health care provider.  Nausea and Vomiting Nausea is a sick feeling that often comes before throwing up (vomiting). Vomiting is a reflex where stomach contents come out of your mouth. Vomiting can cause severe loss of body fluids (dehydration). Children and elderly adults can become dehydrated quickly, especially if they also have diarrhea. Nausea and vomiting are symptoms of a condition or disease. It is important to find the cause of your symptoms. CAUSES   Direct irritation of the stomach lining. This irritation can result from increased acid production (gastroesophageal reflux disease), infection, food poisoning, taking certain medicines (such as nonsteroidal anti-inflammatory drugs), alcohol use, or tobacco use.  Signals from the brain.These signals could be caused by a headache, heat exposure, an inner ear disturbance, increased pressure in the brain from injury, infection, a tumor, or a concussion, pain, emotional stimulus, or metabolic problems.  An obstruction in the gastrointestinal tract (bowel obstruction).  Illnesses such as diabetes, hepatitis, gallbladder problems, appendicitis, kidney problems, cancer, sepsis, atypical symptoms of a heart attack, or eating disorders.  Medical treatments such as  chemotherapy and radiation.  Receiving medicine that makes you sleep (general anesthetic) during surgery. DIAGNOSIS Your caregiver may ask for tests to be done if the problems do not improve after a few days. Tests may also be done if symptoms are severe or if the reason for the nausea and vomiting is not clear. Tests may include:  Urine tests.  Blood tests.  Stool tests.  Cultures (to look for evidence of infection).  X-rays or other imaging studies. Test results can help your caregiver make decisions about treatment or the need for additional tests. TREATMENT You need to stay well hydrated. Drink frequently but in small amounts.You may wish to drink water, sports drinks, clear broth, or eat frozen ice pops or gelatin dessert to help stay hydrated.When you eat, eating slowly may help prevent nausea.There are also some antinausea medicines that may help prevent nausea. HOME CARE INSTRUCTIONS   Take all medicine as directed by your caregiver.  If you do not have an appetite, do not force yourself to eat. However, you must continue to drink fluids.  If you have an appetite, eat a normal diet unless your caregiver tells you differently.  Eat a variety of complex carbohydrates (rice, wheat, potatoes, bread), lean meats, yogurt, fruits, and vegetables.  Avoid high-fat foods because they are more difficult to digest.  Drink enough water and fluids to keep your urine clear or pale yellow.  If you are dehydrated, ask your caregiver for specific rehydration instructions. Signs of dehydration may include:  Severe thirst.  Dry lips and mouth.  Dizziness.  Dark urine.  Decreasing urine frequency and amount.  Confusion.  Rapid breathing or pulse. SEEK IMMEDIATE MEDICAL CARE IF:   You have blood or brown flecks (like coffee grounds) in your vomit.  You have black or bloody stools.  You have a severe headache or stiff neck.  You are confused.  You have severe abdominal  pain.  You have chest pain or trouble breathing.  You do not urinate at least once every 8 hours.  You develop cold or clammy skin.  You continue to vomit for longer than 24 to 48 hours.  You have a fever. MAKE SURE YOU:   Understand these instructions.  Will watch your condition.  Will get help right away if you are not doing well or get worse. Document Released: 08/19/2005 Document Revised: 11/11/2011 Document Reviewed: 01/16/2011 [facility] Patient Information 2015 [place], [place]. This information is not intended to replace advice given to you by your health care provider. Make sure you discuss any questions you have with your health care provider.

## 2015-05-24 NOTE — ED Notes (Signed)
CHECKED CBG 157 RN MELISSA INFORMED

## 2015-05-24 NOTE — ED Provider Notes (Signed)
CSN: [id number]     Arrival date & time 05/24/15  1217 History   First MD Initiated Contact with Patient 05/24/15 1258     Chief Complaint  Patient presents with  . Seizures     (Consider location/radiation/quality/duration/timing/severity/associated sxs/prior Treatment) HPI 39 year old female who presents with syncope versus seizure. She is otherwise healthy. Reports that yesterday developed significant nausea, vomiting, and diarrhea. She has had decreased by mouth intake. Today, was in [place] with her mother when she felt lightheaded, nauseous, and had loss of consciousness subsequently. Her mother witnessed this, and reports that initially she did seem to have jerking of her limbs which she felt may have been due to seizure, but these jerking movements subsequently resided, and she reports that she was just lying on the ground. They had turned her over, and after a few minutes patient came to. Initially was disoriented to time and place, but quickly returned to her baseline. Denies any preceding headaches, chest pain, vomiting, or difficulty breathing. She denies any recent abdominal pain, fevers, or urinary complaints. Denies any urinary incontinence or tongue biting.   History reviewed. No pertinent past medical history. Past Surgical History  Procedure Laterality Date  . Cesarean section     History reviewed. No pertinent family history. Social History  Substance Use Topics  . Smoking status: Never Smoker   . Smokeless tobacco: None  . Alcohol Use: No   OB History    No data available     Review of Systems 10/14 systems reviewed and are negative other than those stated in the HPI    Allergies  Amoxicillin and Sulfa antibiotics  Home Medications   Prior to Admission medications   Medication Sig Start Date End Date Taking? Authorizing Provider  Buprenorphine HCl-Naloxone HCl (SUBOXONE) 4-1 MG FILM Place 1 tablet under the tongue daily.   Yes Historical Provider, MD   FLUoxetine (PROZAC) 40 MG capsule Take 40 mg by mouth daily.   Yes Historical Provider, MD  zolpidem (AMBIEN) 10 MG tablet Take 10 mg by mouth at bedtime as needed for sleep.   Yes Historical Provider, MD  ondansetron (ZOFRAN) 4 MG tablet Take 1 tablet (4 mg total) by mouth every 8 (eight) hours as needed for nausea or vomiting. 05/24/15   Lavera Guise, MD   BP 103/64 mmHg  Pulse 81  Temp(Src) 98.1 F (36.7 C) (Oral)  Resp 12  Ht  (1.727 m)  Wt 135 lb (61.236 kg)  BMI 20.53 kg/m2  SpO2 99%  LMP 05/13/2015 (Approximate) Physical Exam Physical Exam  Nursing note and vitals reviewed. Constitutional: Well developed, well nourished, non-toxic, and in no acute distress Head: Normocephalic. Bruising and swelling noted to the right side of her face, primarily periorbitally and over the right maxillary sinus. Tenderness to palpation over this area. Mouth/Throat: Oropharynx is clear and moist.  no oral trauma. Neck: Normal range of motion. Neck supple.  no cervical spine tenderness. Cardiovascular: Normal rate and regular rhythm.   Pulmonary/Chest: Effort normal and breath sounds normal.  Abdominal: Soft. There is no tenderness. There is no rebound and no guarding.  Musculoskeletal: Normal range of motion.  Neurological: Alert, no facial droop, fluent speech, moves all extremities symmetrically Skin: Skin is warm and dry.  Psychiatric: Cooperative  ED Course  Procedures (including critical care time) Labs Review Labs Reviewed  CBC WITH DIFFERENTIAL/PLATELET - Abnormal; Notable for the following:    WBC 13.9 (*)    Neutro Abs 12.6 (*)  All other components within normal limits  BASIC METABOLIC PANEL - Abnormal; Notable for the following:    Glucose, Bld 135 (*)    All other components within normal limits  PHOSPHORUS - Abnormal; Notable for the following:    Phosphorus 2.1 (*)    All other components within normal limits  CBG MONITORING, ED - Abnormal; Notable for the  following:    Glucose-Capillary 157 (*)    All other components within normal limits  MAGNESIUM  I-STAT BETA HCG BLOOD, ED ([facility], [facility], [facility] ONLY)  I-STAT TROPOININ, ED    Imaging Review Ct Maxillofacial Wo Cm  05/24/2015   CLINICAL DATA:  Right orbital swelling and bruising after fall today.  EXAM: CT MAXILLOFACIAL WITHOUT CONTRAST  TECHNIQUE: Multidetector CT imaging of the maxillofacial structures was performed. Multiplanar CT image reconstructions were also generated. A small metallic BB was placed on the right temple in order to reliably differentiate right from left.  COMPARISON:  CT scan of February 22, 2007.  FINDINGS: Hemorrhage is noted in the left maxillary sinus due to nondisplaced fracture involving the lateral and posterior wall of the left maxillary sinus. Hemorrhage is also noted in the right maxillary sinus, and this appears to be due to orbital floor fracture which is mildly displaced posteriorly, as well as fractures involving the anterior lateral posterior wall of the maxillary sinus. Mildly angulated fracture involving the right zygomatic arch is noted as well as the posterior base of the zygomatic arch. These findings are consistent with tripod fracture. Globes and orbits appear normal.  IMPRESSION: Hemorrhage is noted in both maxillary sinuses. Nondisplaced fracture is seen involving the lateral wall of the left maxillary sinus. Mildly depressed right tripod fracture is noted, with fractures involving the right zygomatic arch, anterior, lateral and and posterior wall of the right maxillary sinus.   Electronically Signed   By: Lupita Raider, M.D.   On: 05/24/2015 15:42   I have personally reviewed and evaluated these images and lab results as part of my medical decision-making.   EKG Interpretation   Date/Time:  Wednesday May 24 2015 12:29:02 EDT Ventricular Rate:  72 PR Interval:  146 QRS Duration: 100 QT Interval:  390 QTC Calculation: 427 R Axis:   83 Text  Interpretation:  Sinus rhythm TWI and minimal ST depression in  inferior leads No prior EKG for comparison Confirmed by LIU MD, Annabelle Harman  ([id number]) on 05/24/2015 12:40:19 PM      MDM   Final diagnoses:  Syncope and collapse  Nausea vomiting and diarrhea  Closed fracture of tripod, initial encounter  Maxillary sinus fracture, closed, initial encounter    39 year old female who presents with loss of consciousness in the setting of nausea, vomiting, and diarrhea. She is at her baseline mental status on arrival, and is noted to have vital signs within normal limits. She does look dehydrated on exam, with dry mucous membranes. Presentation not concerning for seizure although she was reported to have some jerking of her extremities when she had passed out. In the setting of her significant volume loss, presentation is most consistentwith syncope and myoclonic jerking can be seen in syncope too. No evidence of urinary incontinence, tongue biting, or postictal period. No stigmata of arrhythmia as noted on her EKG. There is some TWI in the inferior leads with no prior EKG for comparison. Troponin is negative. No risk factors or other symptoms for PE, dissection, or acute MI in relation to her syncope. Given her prodromal symptoms, syncope  likely orthostatic in the setting of dehydration. She is not pregnant and basic blood work reviewed and are unremarkable. She does have significant facial swelling and tenderness over the right side of face and CT face performed showing left closed nondisplaced maxillary fracture and tripod fracture of the right side of her face. Discussed with Dr. Willeen Cass from ENT who will follow-up with her in 5 days for re-examination. Patient received zofran and IVF with improvement in her symptoms. She tolerates PO. Abdomen benign and non-tender. Likely benign GI illness causing dehydration and we discussed supportive care instructions for home. Strict return and follow-up instructions  reviewed. She expressed understanding of all discharge instructions and felt comfortable with the plan of care.     Lavera Guise, MD 05/24/15 [phone]

## 2015-05-24 NOTE — ED Notes (Signed)
Pt here via EMS with c/o possible seizure. Pt was in [employer] parking lot when she felt dizzy and fell to the ground. Bystanders reports seizure like activity. Pt reporting one similar episode approx 6 years ago. Pt does not take sz medication. Pt a/o x 4 at this time. Pt has lac above right eye, bleeding controlled. Pt denies blood thinners.

## 2016-05-24 ENCOUNTER — Other Ambulatory Visit (HOSPITAL_COMMUNITY): Payer: Self-pay | Admitting: Respiratory Therapy

## 2016-05-24 DIAGNOSIS — R569 Unspecified convulsions: Secondary | ICD-10-CM

## 2016-06-06 ENCOUNTER — Ambulatory Visit (HOSPITAL_COMMUNITY)
Admission: RE | Admit: 2016-06-06 | Discharge: 2016-06-06 | Disposition: A | Discharge status: Home / Self Care | Payer: Medicaid Other | Source: Ambulatory Visit | Attending: Neurology | Admitting: Neurology

## 2016-06-06 DIAGNOSIS — R569 Unspecified convulsions: Secondary | ICD-10-CM

## 2016-06-06 DIAGNOSIS — Z79899 Other long term (current) drug therapy: Secondary | ICD-10-CM | POA: Diagnosis not present

## 2016-06-06 NOTE — Progress Notes (Signed)
OP adult EEG completed, results pending. 

## 2016-06-06 NOTE — Procedures (Signed)
ELECTROENCEPHALOGRAM REPORT  Date of Study: 06/06/2016  Patient's Name: Chrys RacerKelly E Rabinovich MRN: [id number] Date of Birth: 1976-07-24  Referring Provider: Dr. Oleh Geninlukayode Onasanya  Clinical History: This is a 40 year old woman with an episode of seizure versus sycnope.   Medications: Prozac, birth control  Technical Summary: A multichannel digital EEG recording measured by the international 10-20 system with electrodes applied with paste and impedances below 5000 ohms performed in our laboratory with EKG monitoring in an awake and drowsy patient.  Hyperventilation and photic stimulation were performed.  The digital EEG was referentially recorded, reformatted, and digitally filtered in a variety of bipolar and referential montages for optimal display.    Description: The patient is awake and drowsy during the recording.  During maximal wakefulness, there is a symmetric, medium voltage 10 Hz posterior dominant rhythm that attenuates with eye opening.  The record is symmetric.  During drowsiness, there is an increase in theta slowing of the background with central beta activity seen. Deeper stages of sleep were not seen. Hyperventilation and photic stimulation did not elicit any abnormalities.  There were no epileptiform discharges or electrographic seizures seen.    EKG lead was unremarkable.  Impression: This awake and drowsy EEG is normal.    Clinical Correlation: A normal EEG does not exclude a clinical diagnosis of epilepsy. Clinical correlation is advised.   Patrcia DollyKaren Fenix Ruppe, M.D.

## 2016-06-18 ENCOUNTER — Other Ambulatory Visit (HOSPITAL_COMMUNITY): Payer: Self-pay | Admitting: Respiratory Therapy

## 2016-06-18 DIAGNOSIS — R569 Unspecified convulsions: Secondary | ICD-10-CM

## 2016-06-26 ENCOUNTER — Other Ambulatory Visit (HOSPITAL_COMMUNITY): Payer: Self-pay | Admitting: Respiratory Therapy

## 2016-09-02 DEATH — deceased

## 2017-03-19 IMAGING — CT CT MAXILLOFACIAL W/O CM
3 series · 16 of 47 positions shown, 19 images · non-contrast
Comparison: CT scan of February 22, 2007.

CLINICAL DATA: Right orbital swelling and bruising after fall
today.

EXAM:
CT MAXILLOFACIAL WITHOUT CONTRAST
TECHNIQUE: Multidetector CT imaging of the maxillofacial structures was
performed. Multiplanar CT image reconstructions were also generated.
A small metallic BB was placed on the right temple in order to
reliably differentiate right from left.

[Series 3: facial/ orbits 2.0 h30s · axial · 0.35mm/px · z∈[+1196,+1322]mm · 10 of 75 slices shown, 13 images]
[im 6/75  brain]
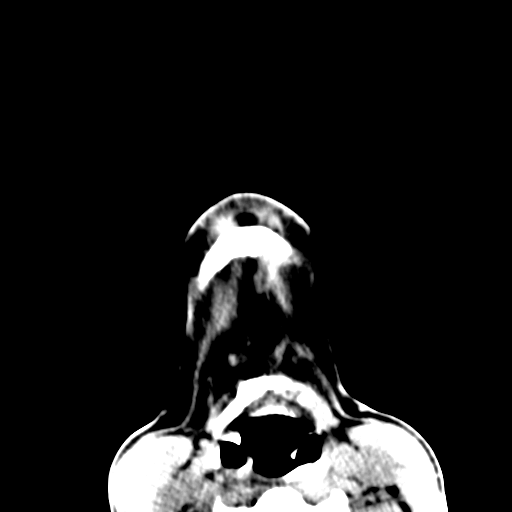
[im 6/75  bone]
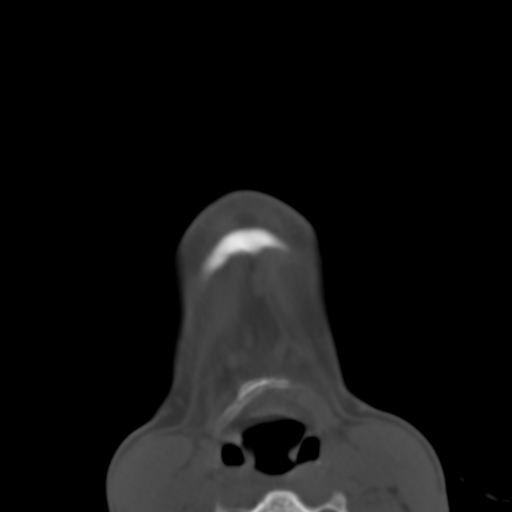
[im 13/75  bone]
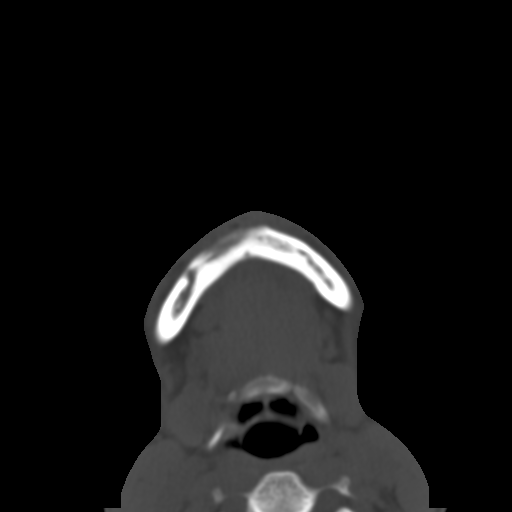
[im 21/75  bone]
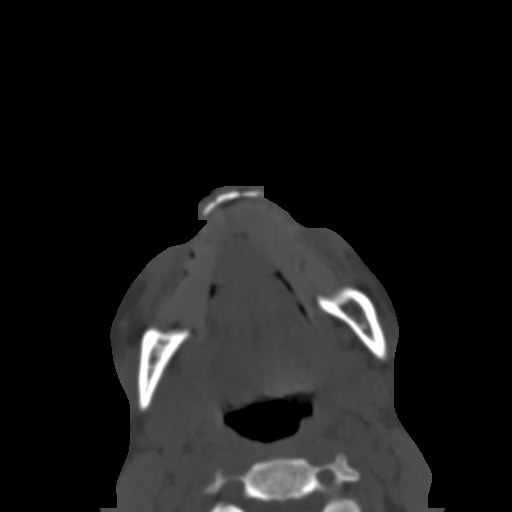
[im 26/75  bone]
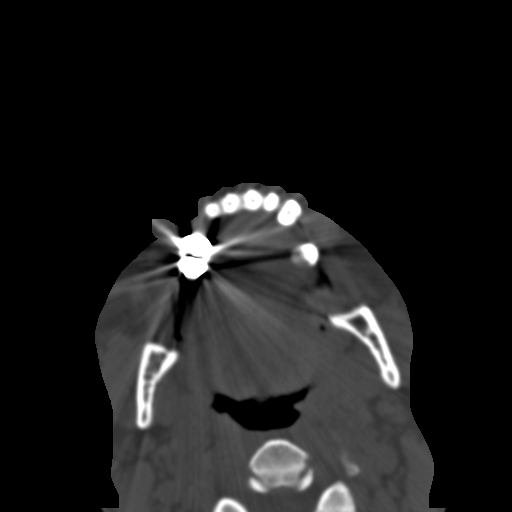
[im 34/75  brain]
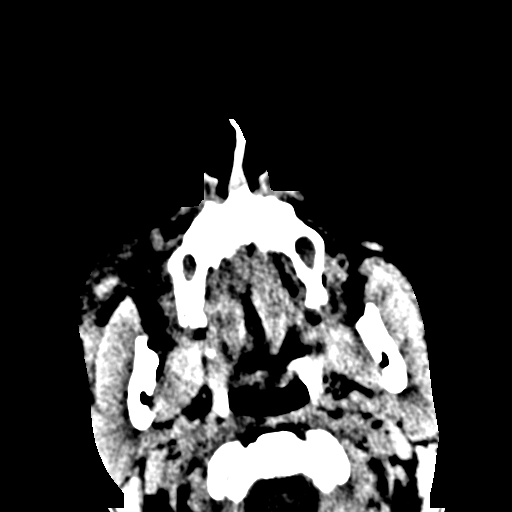
[im 34/75  bone]
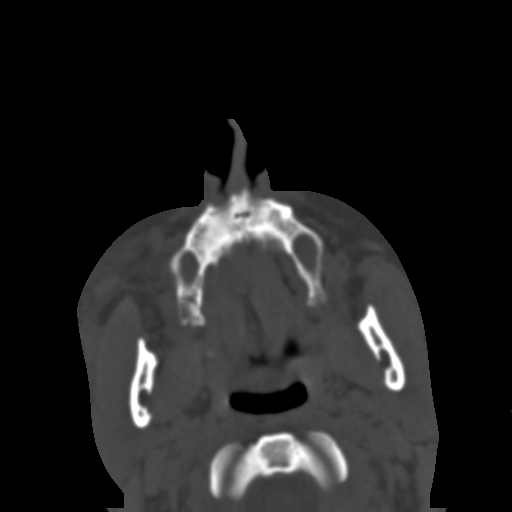
[im 41/75  bone]
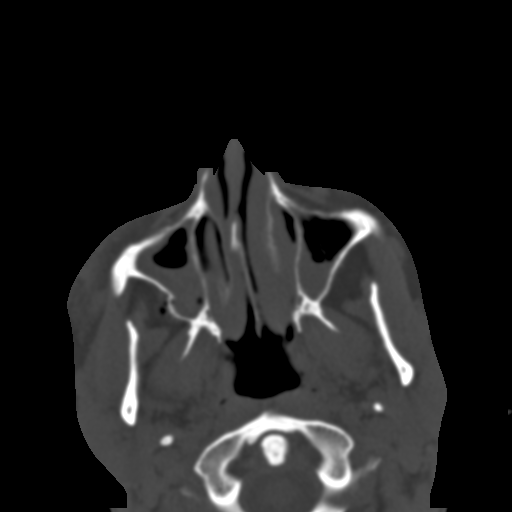
[im 49/75  bone]
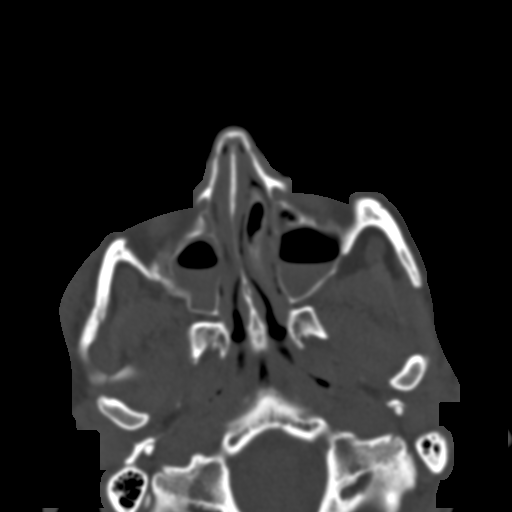
[im 57/75  bone]
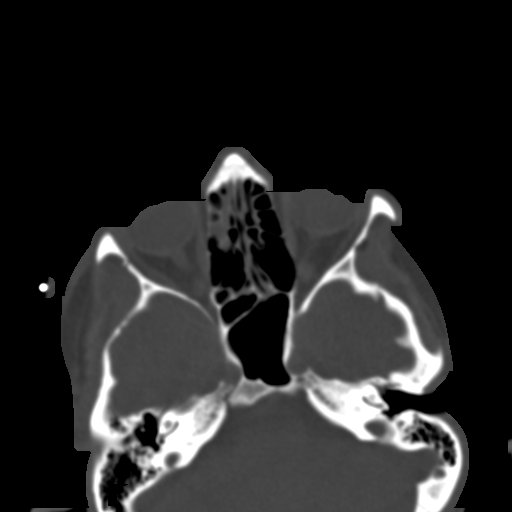
[im 62/75  brain]
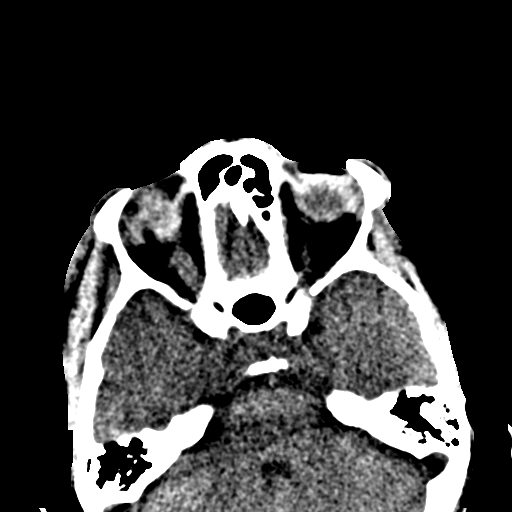
[im 62/75  bone]
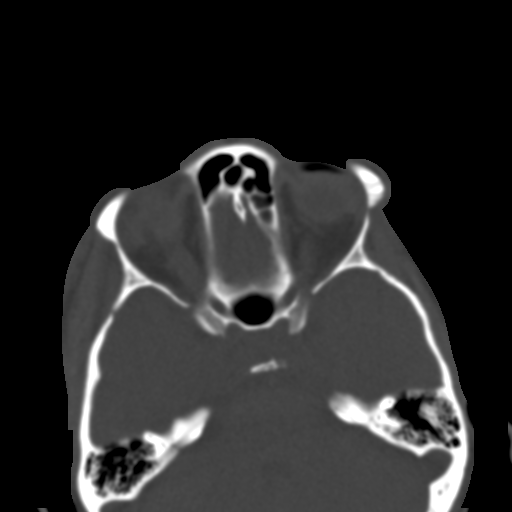
[im 69/75  bone]
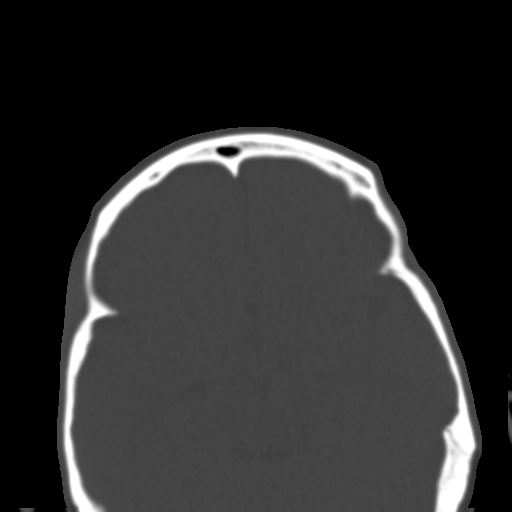

[Series 6: coronal soft tissue · coronal · 0.34mm/px · 3 of 70 slices shown]
[im 24/70  bone]
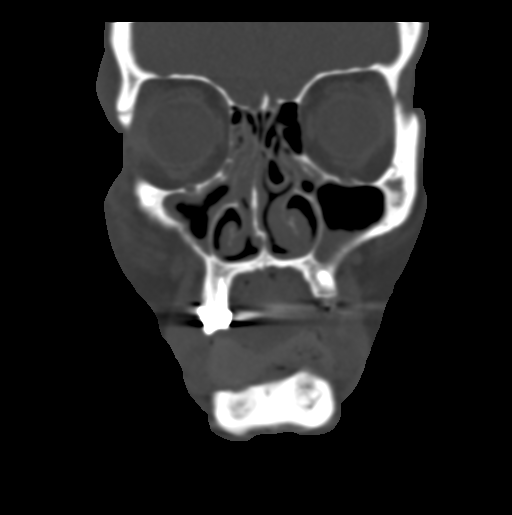
[im 31/70  bone]
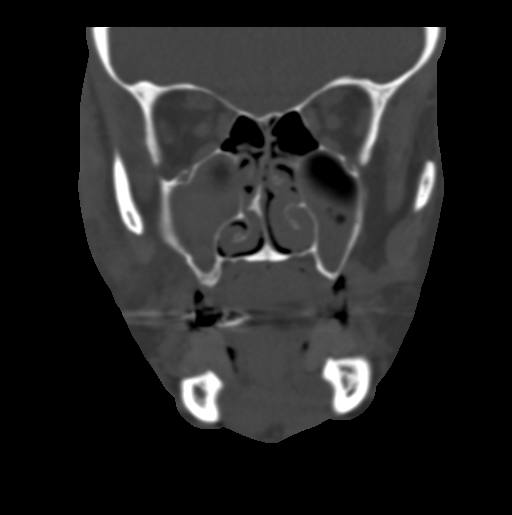
[im 39/70  bone]
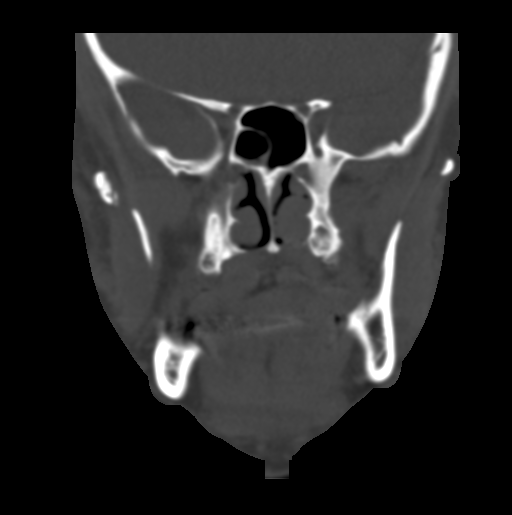

[Series 7: sagittal soft tissue · sagittal · 0.29mm/px · 3 of 73 slices shown]
[im 25/73  bone]
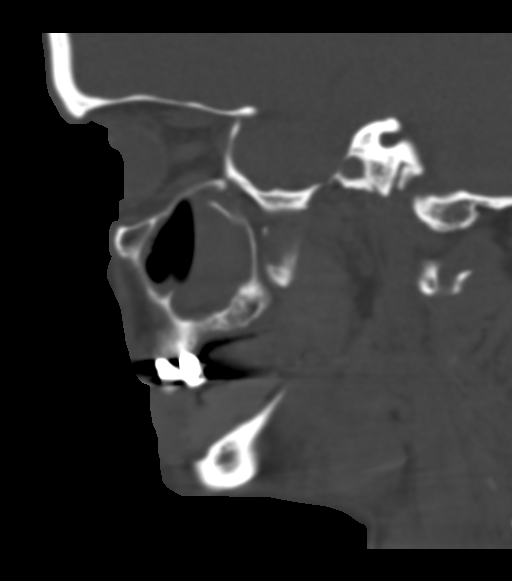
[im 37/73  bone]
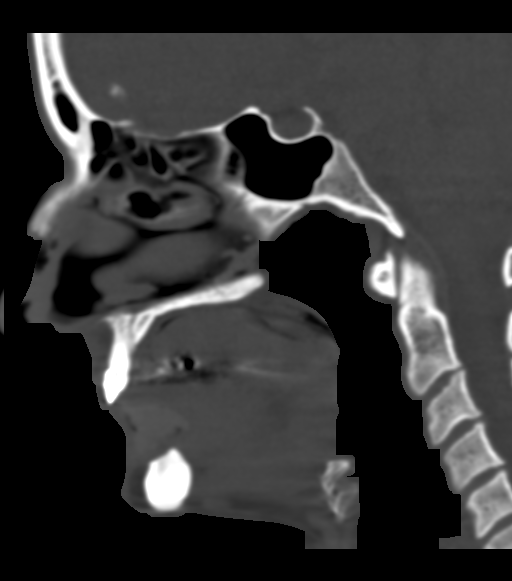
[im 49/73  bone]
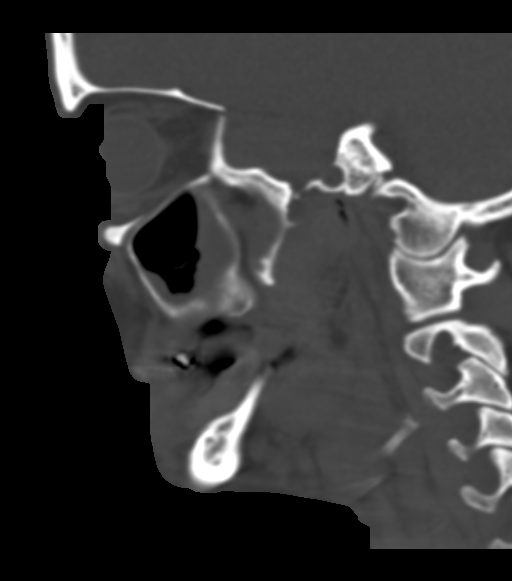

[16 of 47 positions shown; findings below may reference images not displayed]

FINDINGS: Hemorrhage is noted in the left maxillary sinus due to nondisplaced
fracture involving the lateral and posterior wall of the left
maxillary sinus. Hemorrhage is also noted in the right maxillary
sinus, and this appears to be due to orbital floor fracture which is
mildly displaced posteriorly, as well as fractures involving the
anterior lateral posterior wall of the maxillary sinus. Mildly
angulated fracture involving the right zygomatic arch is noted as
well as the posterior base of the zygomatic arch. These findings are
consistent with tripod fracture. Globes and orbits appear normal.
IMPRESSION: Hemorrhage is noted in both maxillary sinuses. Nondisplaced fracture
is seen involving the lateral wall of the left maxillary sinus.
Mildly depressed right tripod fracture is noted, with fractures
involving the right zygomatic arch, anterior, lateral and and
posterior wall of the right maxillary sinus.
